# Patient Record
Sex: Female | Born: 1960 | Race: Black or African American | Hispanic: No | Marital: Single
Health system: Southern US, Community
[De-identification: ages and names within clinical notes are randomized; demographics above are authoritative.]

---

## 2006-09-27 ENCOUNTER — Emergency Department: Payer: Self-pay | Admitting: Emergency Medicine

## 2007-02-13 ENCOUNTER — Emergency Department: Payer: Self-pay | Admitting: Internal Medicine

## 2007-08-10 ENCOUNTER — Emergency Department: Payer: Self-pay | Admitting: Emergency Medicine

## 2007-11-07 ENCOUNTER — Emergency Department: Payer: Self-pay | Admitting: Emergency Medicine

## 2007-11-09 ENCOUNTER — Ambulatory Visit: Payer: Self-pay | Admitting: Family Medicine

## 2008-02-16 ENCOUNTER — Emergency Department: Payer: Self-pay | Admitting: Emergency Medicine

## 2012-03-15 ENCOUNTER — Emergency Department: Payer: Self-pay | Admitting: Emergency Medicine

## 2012-03-15 LAB — COMPREHENSIVE METABOLIC PANEL
Albumin: 3.8 g/dL (ref 3.4–5.0)
Bilirubin,Total: 0.3 mg/dL (ref 0.2–1.0)
Chloride: 107 mmol/L (ref 98–107)
EGFR (African American): >60
Glucose: 108 mg/dL — ABNORMAL HIGH (ref 65–99)
SGOT(AST): 32 U/L (ref 15–37)
Sodium: 140 mmol/L (ref 136–145)

## 2012-03-15 LAB — CBC
HGB: 15.4 g/dL (ref 12.0–16.0)
MCH: 26.6 pg (ref 26.0–34.0)
MCHC: 32.3 g/dL (ref 32.0–36.0)
Platelet: 279 10*3/uL (ref 150–440)
RDW: 14.2 % (ref 11.5–14.5)
WBC: 10.8 10*3/uL (ref 3.6–11.0)

## 2012-03-15 LAB — URINALYSIS, COMPLETE
Bilirubin,UR: NEGATIVE
Nitrite: POSITIVE
RBC,UR: 14 /HPF (ref 0–5)
Squamous Epithelial: 3
WBC UR: 107 /HPF (ref 0–5)

## 2012-03-15 LAB — PRO B NATRIURETIC PEPTIDE: B-Type Natriuretic Peptide: 54 pg/mL (ref 0–125)

## 2012-03-15 LAB — APTT: Activated PTT: 33.4 secs (ref 23.6–35.9)

## 2012-03-15 LAB — CK TOTAL AND CKMB (NOT AT ARMC): CK-MB: 1.1 ng/mL (ref 0.5–3.6)

## 2012-03-15 LAB — PROTIME-INR: INR: 0.9

## 2014-05-07 ENCOUNTER — Ambulatory Visit: Payer: Self-pay | Admitting: Emergency Medicine

## 2014-06-30 NOTE — Consult Note (Signed)
PATIENT NAME:  Penny Mclean, Penny Mclean MR#:  [id number] DATE OF BIRTH:  09/08/1960  DATE OF CONSULTATION:  05/07/2014  REFERRING PHYSICIAN:  [facility] CONSULTING PHYSICIAN:  Penny AdolphMatthew Romen Yutzy, MD  REASON FOR CONSULTATION: Foreign body in the esophagus.   HISTORY OF PRESENT ILLNESS: Ms. Penny Mclean is a 54 year old female with a past medical history notable only for paroxysmal atrial fibrillation, not on anticoagulation, who presented to the Emergency Room for evaluation of a foreign body sensation. Earlier in the day, Ms. Penny Mclean was eating some chicken and followed by some oatmeal on her plate. After finishing the chicken and then starting to eat some oatmeal, she noticed a sensation of something stuck in her neck.   In the Emergency Room, she had a CT scan of the neck and the chest. The CT chest did show an esophageal foreign body at the level of the left main stem bronchus. Based on this, the recommendation was to proceed with upper endoscopy.   Prior to this episode, she has not had any gastrointestinal trouble whatsoever. Specifically, she denies any trouble with dysphagia, GERD, rectal bleeding, melena, nausea, vomiting, abdominal pain or weight loss.   PAST MEDICAL HISTORY: Just paroxysmal atrial fibrillation.   HOME MEDICATIONS: None.   FAMILY HISTORY: No family history of GI malignancy.   SOCIAL HISTORY: No alcohol or tobacco.    PHYSICAL EXAMINATION: GENERAL: Alert and oriented times 4.  No acute distress. Appears stated age. HEENT: Normocephalic/atraumatic. Extraocular movements are intact. Anicteric. NECK: Soft, supple. JVP appears normal. No adenopathy. CHEST: Clear to auscultation. No wheeze or crackle. Respirations unlabored. HEART: Regular. No murmur, rub, or gallop.  Normal S1 and S2. ABDOMEN: Soft, nontender. Mild diffuse distention of the abdomen attributable to uterine fibroids.  Normal active bowel sounds in all four quadrants.  No organomegaly. No masses. EXTREMITIES:  No swelling, well perfused. SKIN: No rash or lesion. Skin color, texture, turgor normal. NEUROLOGICAL: Grossly intact. PSYCHIATRIC: Normal tone and affect. MUSCULOSKELETAL: No joint swelling or erythema.   LABORATORY DATA: She did have a CT chest showing foreign body in the esophagus.   ASSESSMENT AND PLAN: Foreign body in the esophagus.   RECOMMENDATIONS:  1.  Upper endoscopy for foreign body removal with anesthesia assistance and intubation for airway protection.   2.  The risks and benefits were discussed and Ms. Penny Mclean is in agreement with going ahead with the procedure. Further recommendations pending the findings on upper endoscopy.  Thank you for this consult.    ____________________________ Penny AdolphMatthew Devonta Blanford, MD mr:TM D: 05/07/2014 18:43:45 ET T: 05/07/2014 22:37:34 ET JOB#: [id number]  cc: Penny AdolphMatthew Zacari Radick, MD, <Dictator> Kathalene FramesMATTHEW G Nashalie Sallis MD ELECTRONICALLY SIGNED 06/06/2014 12:58

## 2019-09-17 ENCOUNTER — Emergency Department: Payer: No Typology Code available for payment source

## 2019-09-17 ENCOUNTER — Emergency Department
Admission: EM | Admit: 2019-09-17 | Discharge: 2019-09-17 | Disposition: A | Discharge status: Home / Self Care | Payer: No Typology Code available for payment source | Attending: Emergency Medicine | Admitting: Emergency Medicine

## 2019-09-17 ENCOUNTER — Other Ambulatory Visit: Payer: Self-pay

## 2019-09-17 DIAGNOSIS — S161XXA Strain of muscle, fascia and tendon at neck level, initial encounter: Secondary | ICD-10-CM | POA: Insufficient documentation

## 2019-09-17 DIAGNOSIS — Y999 Unspecified external cause status: Secondary | ICD-10-CM | POA: Diagnosis not present

## 2019-09-17 DIAGNOSIS — S29012A Strain of muscle and tendon of back wall of thorax, initial encounter: Secondary | ICD-10-CM | POA: Insufficient documentation

## 2019-09-17 DIAGNOSIS — G44319 Acute post-traumatic headache, not intractable: Secondary | ICD-10-CM | POA: Diagnosis not present

## 2019-09-17 DIAGNOSIS — S29019A Strain of muscle and tendon of unspecified wall of thorax, initial encounter: Secondary | ICD-10-CM

## 2019-09-17 DIAGNOSIS — Y939 Activity, unspecified: Secondary | ICD-10-CM | POA: Insufficient documentation

## 2019-09-17 DIAGNOSIS — Y92410 Unspecified street and highway as the place of occurrence of the external cause: Secondary | ICD-10-CM | POA: Insufficient documentation

## 2019-09-17 DIAGNOSIS — S199XXA Unspecified injury of neck, initial encounter: Secondary | ICD-10-CM | POA: Diagnosis present

## 2019-09-17 MED ORDER — TRAMADOL HCL 50 MG PO TABS: 50.0000 mg | ORAL_TABLET | Freq: Four times a day (QID) | ORAL | 0 refills | Status: AC | PRN

## 2019-09-17 MED ORDER — METHOCARBAMOL 500 MG PO TABS
500.0000 mg | ORAL_TABLET | Freq: Once | ORAL | Status: AC
  Administered 2019-09-17: 500 mg via ORAL
  Filled 2019-09-17: qty 1

## 2019-09-17 MED ORDER — METHOCARBAMOL 500 MG PO TABS: 500.0000 mg | ORAL_TABLET | Freq: Four times a day (QID) | ORAL | 0 refills | Status: AC

## 2019-09-17 MED ORDER — TRAMADOL HCL 50 MG PO TABS
50.0000 mg | ORAL_TABLET | Freq: Once | ORAL | Status: AC
  Administered 2019-09-17: 50 mg via ORAL
  Filled 2019-09-17: qty 1
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
## 2019-09-17 NOTE — Discharge Instructions (Signed)
Follow-up with your primary care provider if any continued problems or concerns.  Begin taking the pain medication as needed for pain and also the muscle relaxant as needed for muscle spasms.  You may use ice or heat to your muscles as needed for discomfort especially around her neck.  Is not unusual for you to be sore and uncomfortable for the next 4 to 5 days even with medication.  Try to move often to prevent getting stiff.  You may also take Tylenol with these 2 medications if additional pain medication is needed.  Do not drive or operate machinery while taking the 2 prescription medications that they could cause drowsiness.
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
